# Patient Record
Sex: Male | Born: 1935 | Race: White | Hispanic: No | Marital: Married | Smoking: Never smoker
Health system: Southern US, Community
[De-identification: ages and names within clinical notes are randomized; demographics above are authoritative.]

---

## 2005-10-05 ENCOUNTER — Emergency Department: Payer: Self-pay | Admitting: Emergency Medicine

## 2005-10-21 ENCOUNTER — Ambulatory Visit: Payer: Self-pay | Admitting: Specialist

## 2005-10-21 ENCOUNTER — Other Ambulatory Visit: Payer: Self-pay

## 2005-10-28 ENCOUNTER — Ambulatory Visit: Payer: Self-pay | Admitting: Specialist

## 2006-06-27 ENCOUNTER — Ambulatory Visit: Payer: Self-pay | Admitting: Specialist

## 2006-10-27 ENCOUNTER — Inpatient Hospital Stay: Payer: Self-pay | Admitting: Internal Medicine

## 2006-10-27 ENCOUNTER — Other Ambulatory Visit: Payer: Self-pay

## 2007-12-27 IMAGING — CT CT ABD-PELV W/O CM
1 of 2 series · 15 of 32 positions shown, 19 images · non-contrast
Comparison: none

REASON FOR EXAM: Abdominal pain, stone protocol
COMMENTS:

PROCEDURE:     CT  - CT ABDOMEN AND PELVIS W[DATE] [DATE]
RESULT:     Unenhanced emergent CT scan of abdomen and pelvis was obtained
for abdominal and RIGHT flank pain.
The report was faxed to the Emergency Room.

[Series 2: stone · axial · 0.73mm/px · z∈[-1208,-806]mm · 15 of 151 slices shown, 19 images]
[im 11/151  soft-tissue]
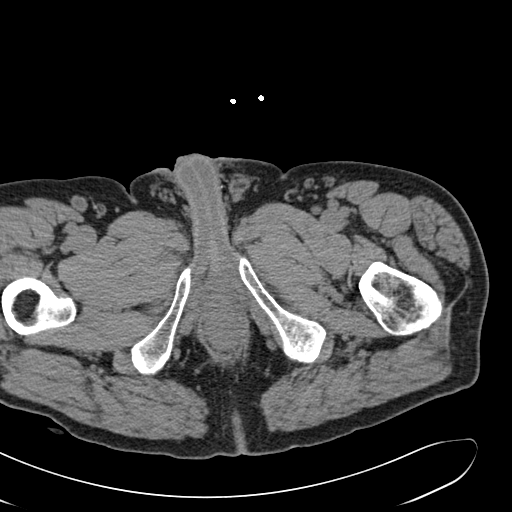
[im 11/151  bone]
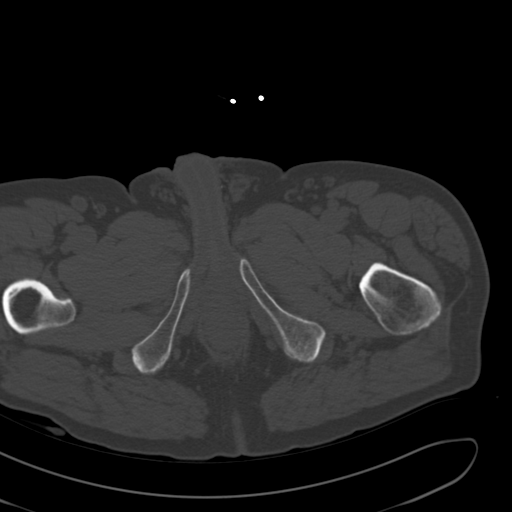
[im 21/151  soft-tissue]
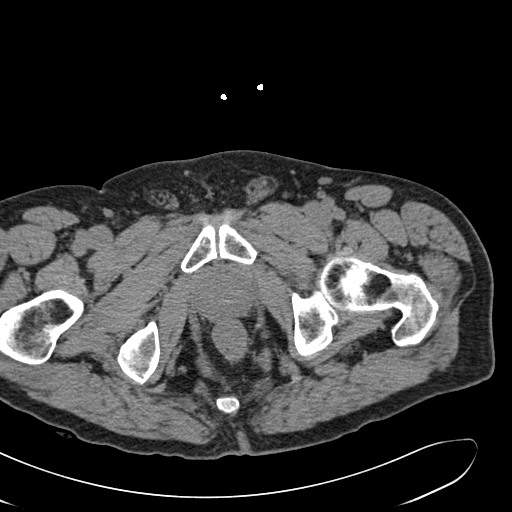
[im 32/151  soft-tissue]
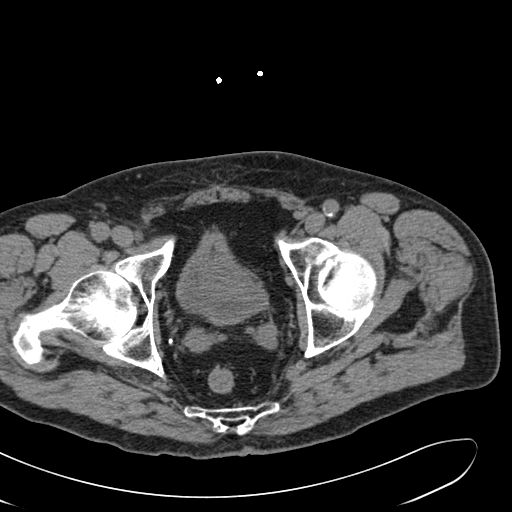
[im 42/151  soft-tissue]
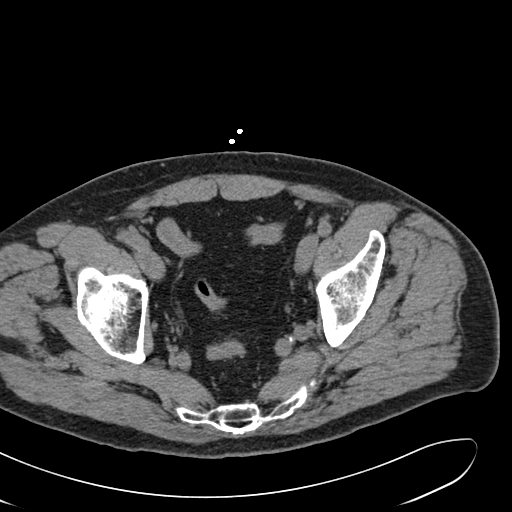
[im 52/151  soft-tissue]
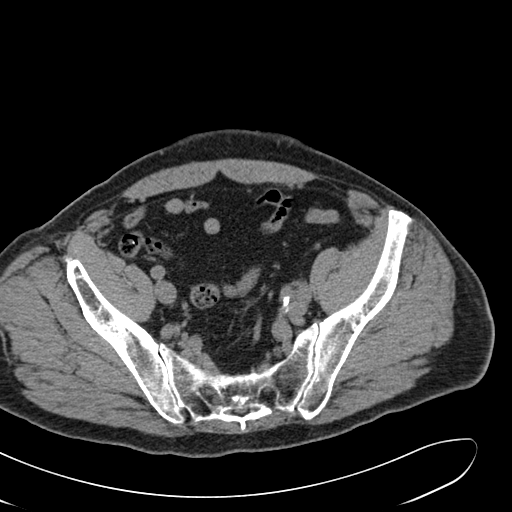
[im 63/151  soft-tissue]
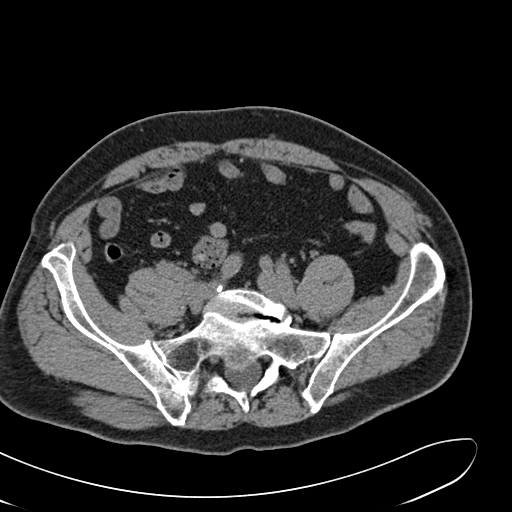
[im 78/151  soft-tissue]
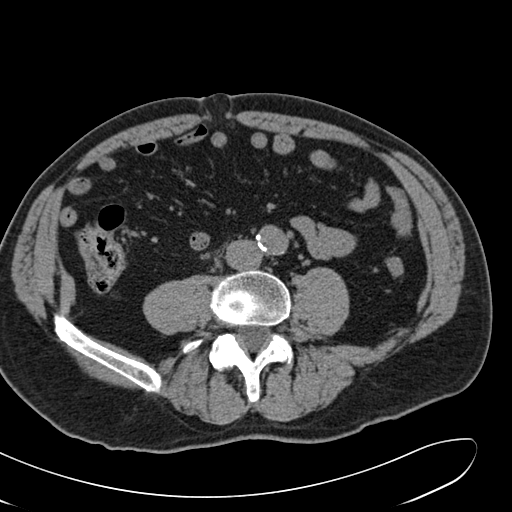
[im 88/151  soft-tissue]
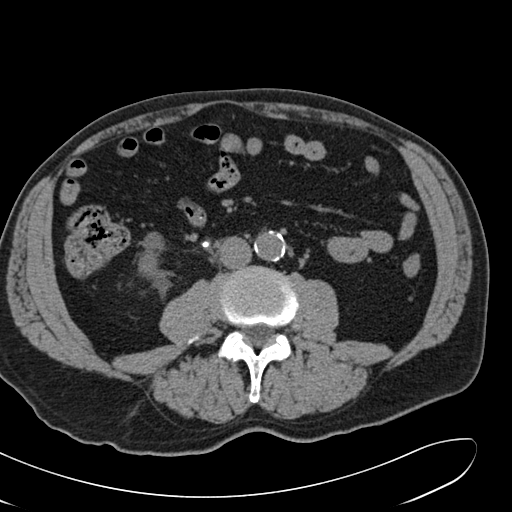
[im 99/151  soft-tissue]
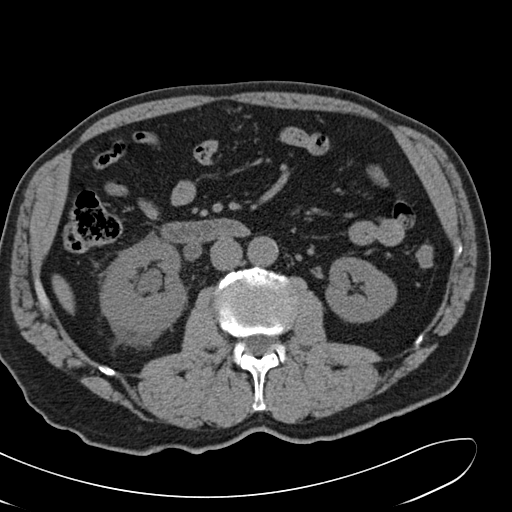
[im 99/151  bone]
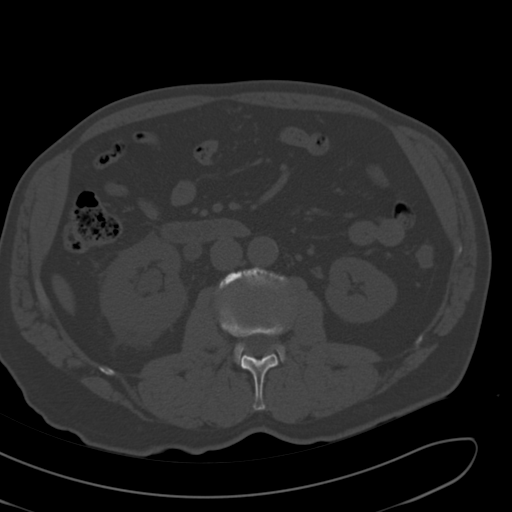
[im 109/151  soft-tissue]
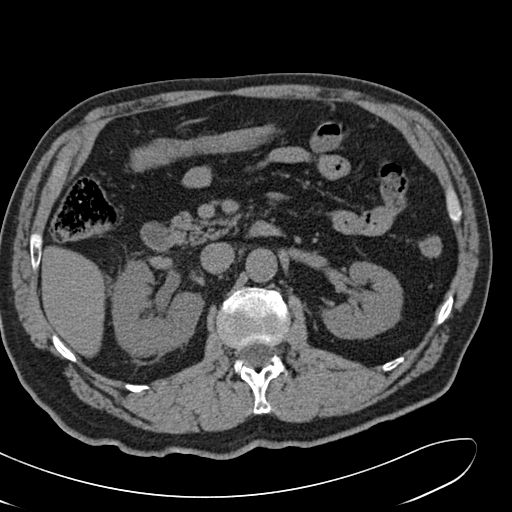
[im 119/151  soft-tissue]
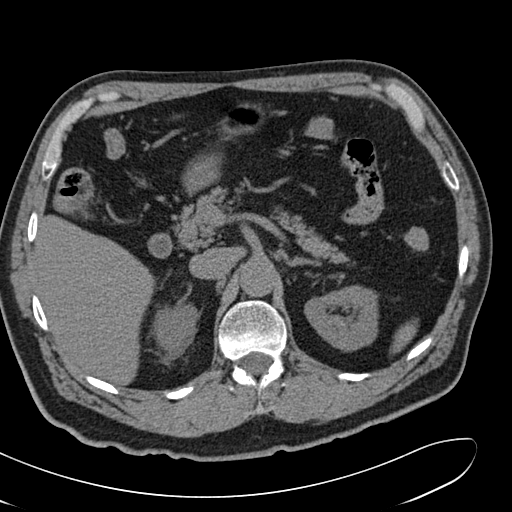
[im 130/151  soft-tissue]
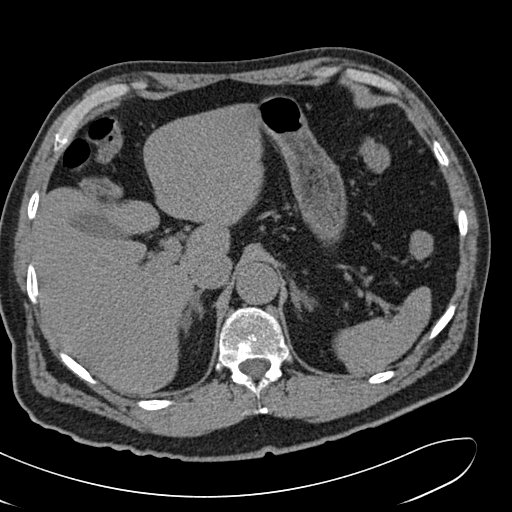
[im 130/151  lung]
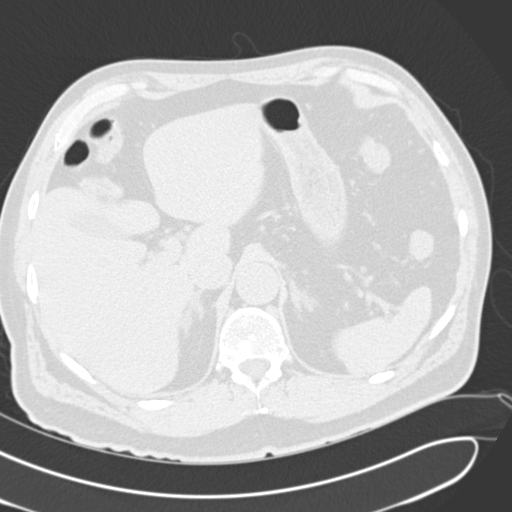
[im 135/151  lung]
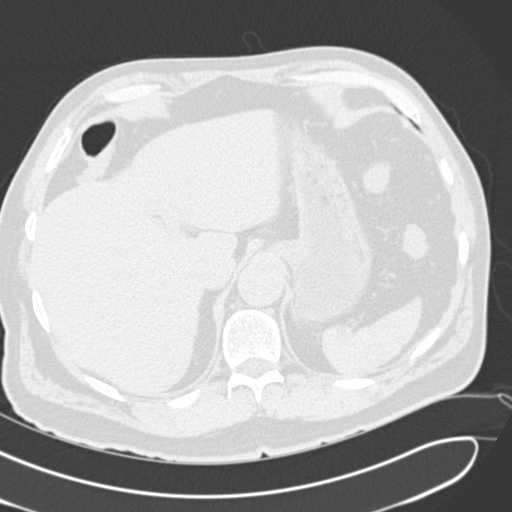
[im 140/151  soft-tissue]
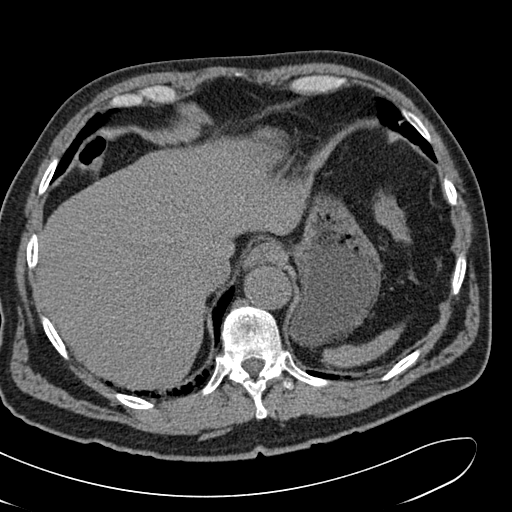
[im 140/151  lung]
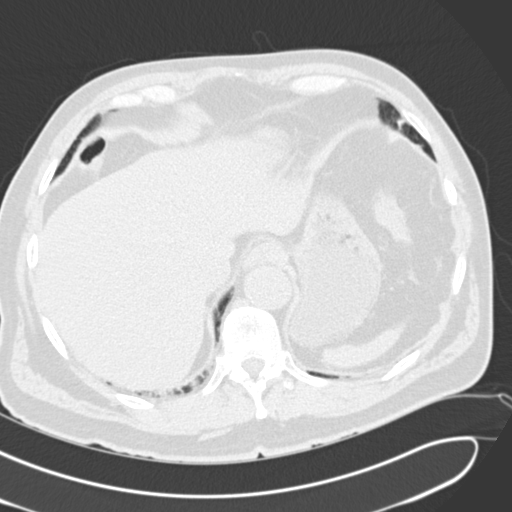
[im 145/151  lung]
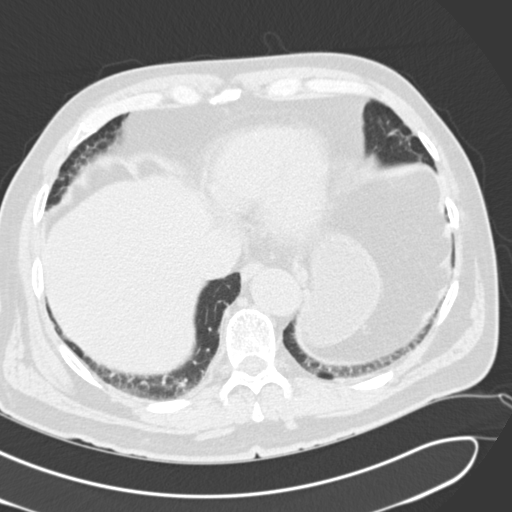

[15 of 32 positions shown; findings below may reference images not displayed]

FINDINGS: There is a 4.0 mm calculus in the mid pole of the RIGHT kidney.
There is stranding about the RIGHT kidney as well as mild hydronephrosis.

Within the proximal RIGHT ureter there is an approximately 6.0 mm calculus.
No fluid is noted in the abdomen or pelvis. There is no evidence of
appendicitis. A small periumbilical hernia containing fat is noted.

The images through the lung bases reveal the lung bases to be clear with no
effusion.
IMPRESSION: 1.     RIGHT perinephric stranding with mild hydronephrosis. There is a
proximal, RIGHT, 6.0 mm ureteral calculus noted. In addition, a RIGHT mid
pole renal calculus is seen.
2.     There is incidentally noted a small, exophytic cyst in the upper pole
of the LEFT kidney.
3.     In addition, there is noted a small, low density nodule in the RIGHT
adrenal most likely representing an adenoma. This can be confirmed with MRI.

## 2008-11-22 ENCOUNTER — Inpatient Hospital Stay: Payer: Self-pay | Admitting: *Deleted

## 2008-12-24 ENCOUNTER — Emergency Department: Payer: Self-pay | Admitting: Emergency Medicine

## 2011-02-13 IMAGING — CT CT CHEST W/ CM
1 of 2 series · 14 of 32 positions shown, 18 images · IV contrast (APPLIED)
Comparison: None

REASON FOR EXAM: tachycardia
COMMENTS:

PROCEDURE:     CT  - CT CHEST (FOR PE) W  - November 22, 2008  [DATE]
RESULT:     Indications: Tachycardia
TECHNIQUE: A thin-section spiral CT from the lung apices to the upper
abdomen was acquired on a multi slice scanner following 85 ml [id number]
intravenous contrast. These images were then transferred to the Siemens work
station and were subsequently reviewed utilizing 3-D reconstructions and MIP
images.

[Series 5: lung windows · axial · 0.91mm/px · z∈[-1218,-939]mm · 14 of 111 slices shown, 18 images]
[im 9/111  mediastinal]
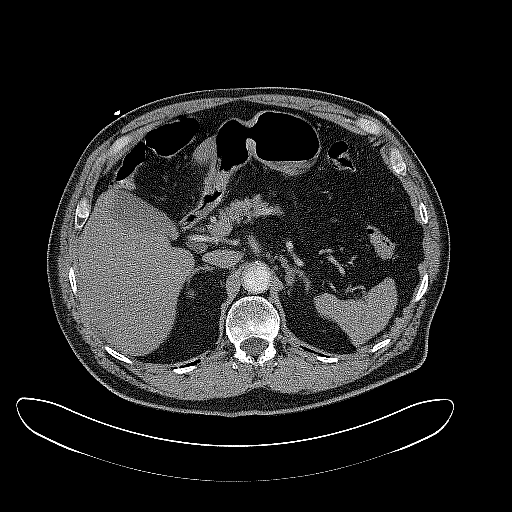
[im 9/111  lung]
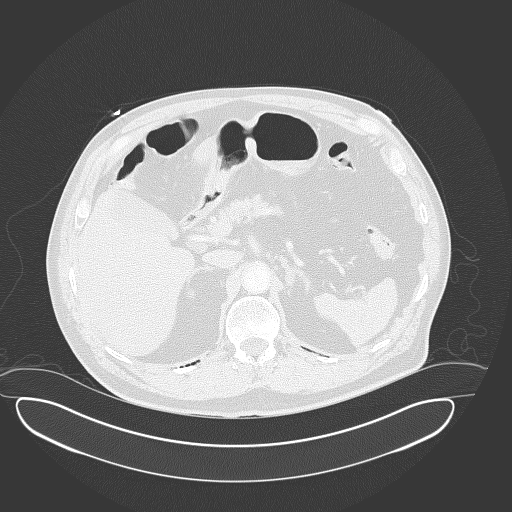
[im 17/111  lung]
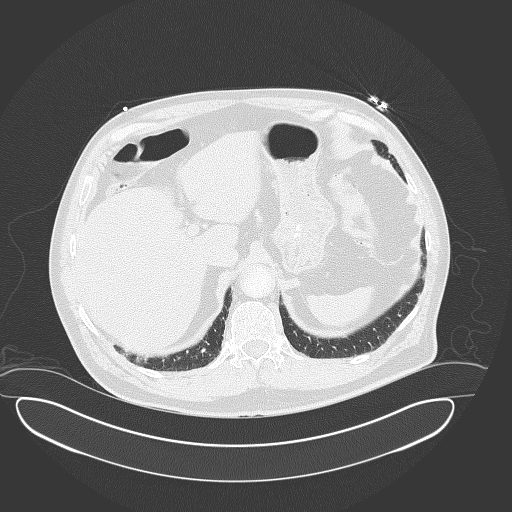
[im 26/111  lung]
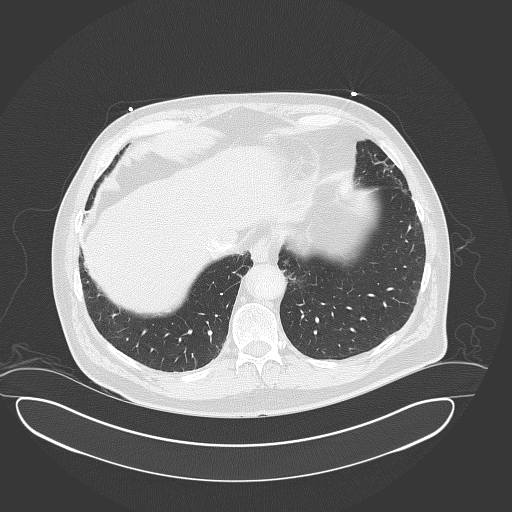
[im 34/111  lung]
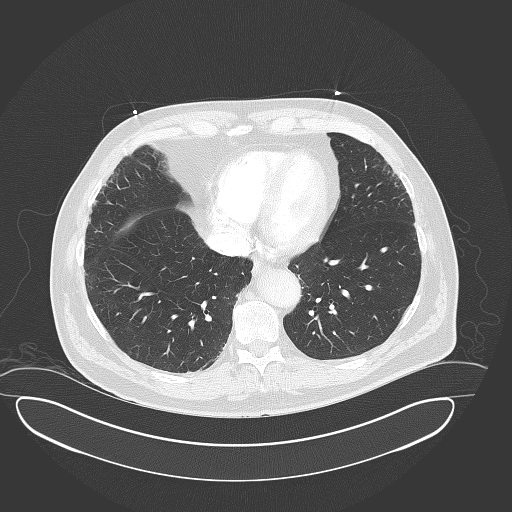
[im 43/111  mediastinal]
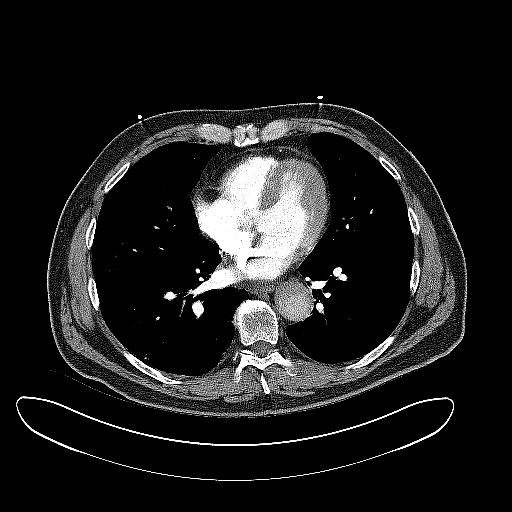
[im 43/111  lung]
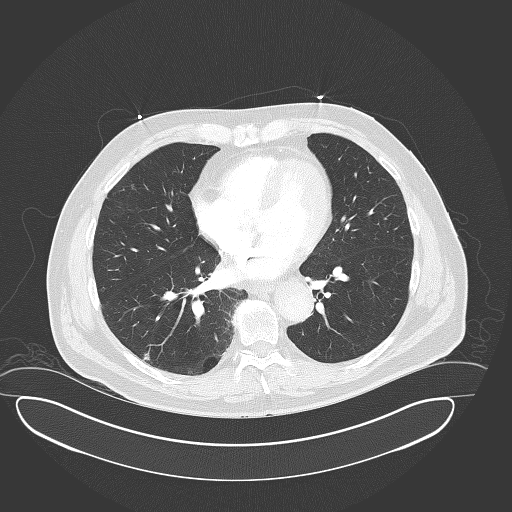
[im 51/111  lung]
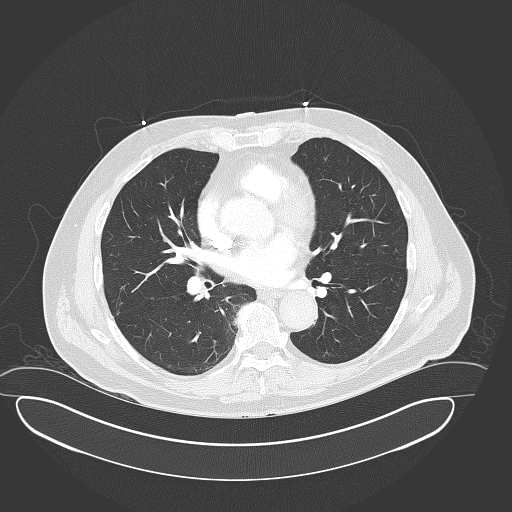
[im 52/111  lung]
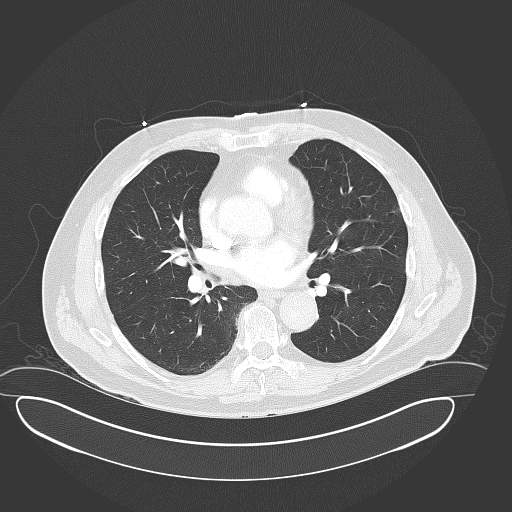
[im 56/111  lung]
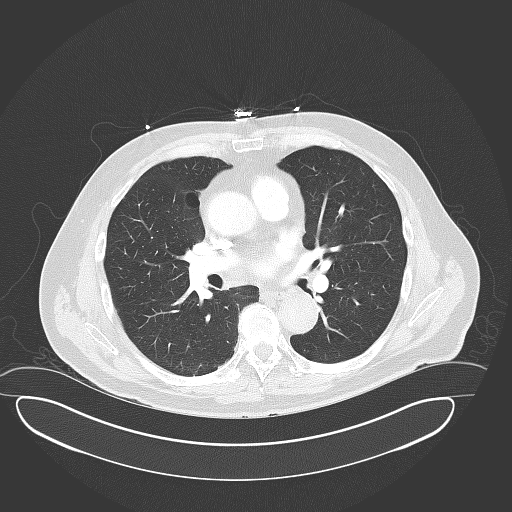
[im 60/111  mediastinal]
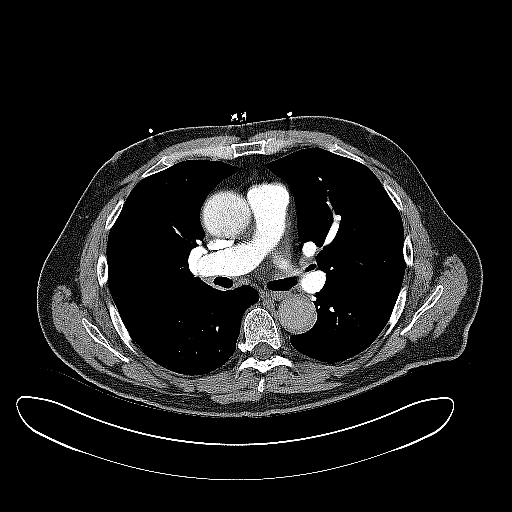
[im 60/111  lung]
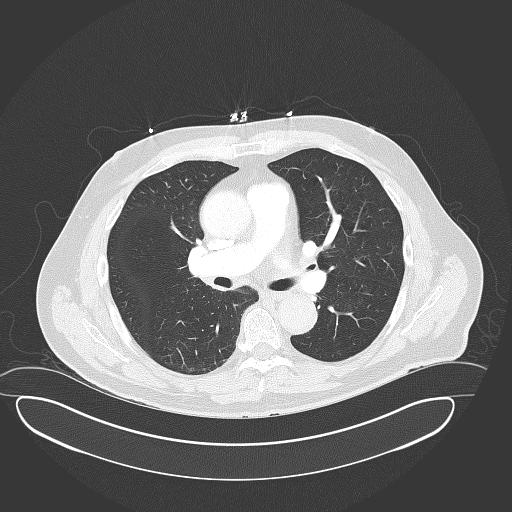
[im 68/111  lung]
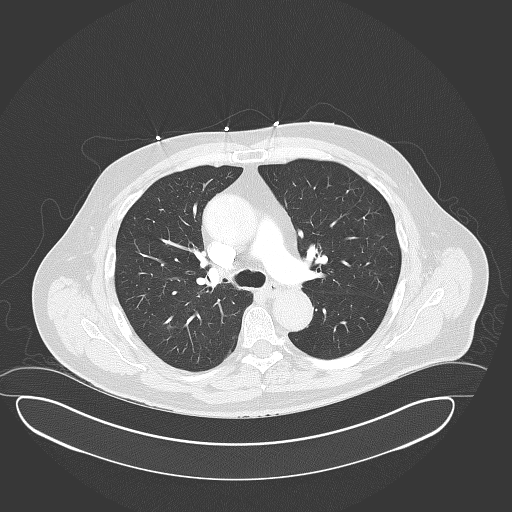
[im 77/111  lung]
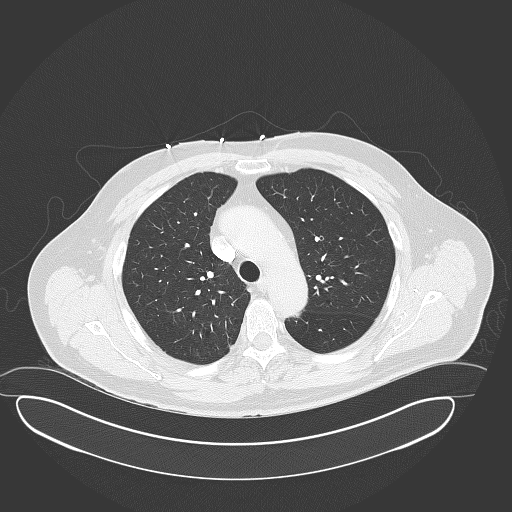
[im 85/111  lung]
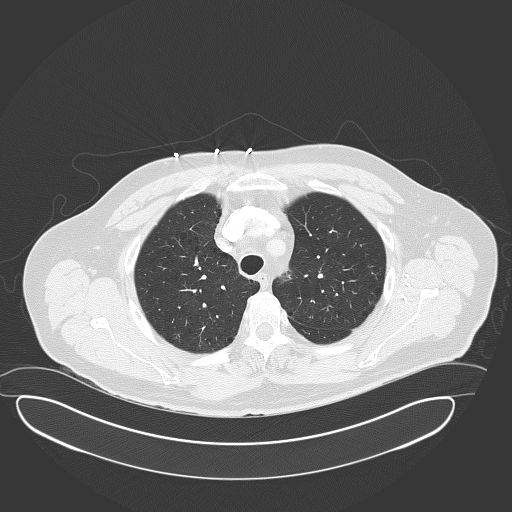
[im 94/111  mediastinal]
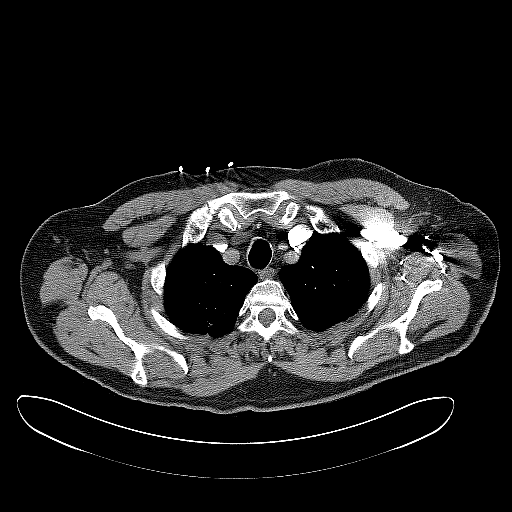
[im 94/111  lung]
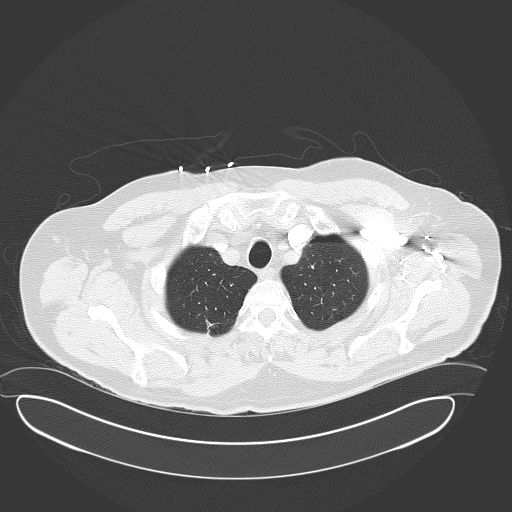
[im 102/111  lung]
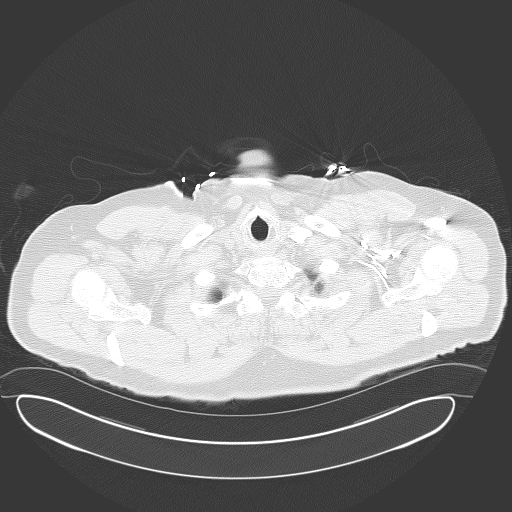

[14 of 32 positions shown; findings below may reference images not displayed]

FINDINGS: There is adequate opacification of the pulmonary arteries. There is no
pulmonary embolus. The main pulmonary artery, right main pulmonary artery,
and left main pulmonary arteries are normal in size. The heart size is
normal. There is no pericardial effusion.

There is a spiculated density in the right apex which may represent
scarring. There is a small nonspecific 3 mm subpleural pulmonary nodule in
the superior segment of the right lower lobe. There is no focal
consolidation, pleural effusion, or pneumothorax.

There is no axillary, hilar, or mediastinal adenopathy.

The osseous structures are unremarkable.

There is a nonspecific 1.4 cm right adrenal nodule which is incompletely
characterized on this exam, but not significantly again noted is a left
renal cyst. changed compared with 10/05/2005.
IMPRESSION: 1. No CT evidence of pulmonary embolus.
2. There is a spiculated density in the right apex which may represent
scarring. Recommend followup chest CT without intravenous contrast in 3-6
months.

## 2014-05-09 ENCOUNTER — Encounter: Payer: Self-pay | Admitting: Podiatry

## 2014-05-09 ENCOUNTER — Ambulatory Visit (INDEPENDENT_AMBULATORY_CARE_PROVIDER_SITE_OTHER): Payer: Medicare Other | Admitting: Podiatry

## 2014-05-09 VITALS — BP 123/77 | HR 74

## 2014-05-09 DIAGNOSIS — L97509 Non-pressure chronic ulcer of other part of unspecified foot with unspecified severity: Secondary | ICD-10-CM

## 2014-05-09 DIAGNOSIS — B351 Tinea unguium: Secondary | ICD-10-CM

## 2014-05-09 DIAGNOSIS — L97511 Non-pressure chronic ulcer of other part of right foot limited to breakdown of skin: Secondary | ICD-10-CM

## 2014-05-09 DIAGNOSIS — M79676 Pain in unspecified toe(s): Secondary | ICD-10-CM

## 2014-05-09 NOTE — Progress Notes (Signed)
   Subjective:    Patient ID: Bruce Thornton, male    DOB: 1929/05/21, 81 y.o.   MRN: [id number]  HPI  81 year old male presents the office they with his wife with complaints of thick, elongated, discolored toenails. The patient is diabetic as well as on Coumadin. The patient's wife states that she periodically which in his toenails however the last time she attempted she cut the skin on the right fifth toe which caused significant bleeding. Patient states the nails Raven the shoes causing discomfort. Denies any recent redness or drainage from around the nail sites. No other complaints at this time.   Review of Systems  All other systems reviewed and are negative.      Objective:   Physical Exam AAO x3, NAD DP/PT pulses palpable, CRT less than 3 seconds Protective sensation decreased with Dorann OuSimms Weinstein motto,, vibratory sensation intact, Achilles tendon reflex intact. Nails are hypertrophic, dystrophic, brittle, elongated 9. The left hallux nails previously been removed. There is no surrounding erythema or drainage from the nail sites. On the medial aspect of the right fifth digit there is a small superficial abrasion from where the skin had previously been cut. There is no surrounding erythema or any clinical signs of infection. No other areas of tenderness to bilateral lower extremities and no overlying erythema or increase in warmth. No pain with calf compression, swelling, warmth, erythema. No other open lesions or pre-ulcerative lesions identified.     Assessment & Plan:  81 year old male with symptomatic onychomycosis; superficial abrasion right medial fifth toe -Treatment options were discussed including alternatives, risks, complications. -Nail sharply debrided 9 without complication/bleeding. -On the right fifth toe recommended to apply small lateral interbody ointment and a Band-Aid daily. Monitor this area for any clinical signs or symptoms of infection and directed to  call the office immediately should any occur or go to the ER. -Discussed the importance of daily foot inspection. -Follow-up in 3 months or sooner should any problems arise. Follow-up in 2 weeks if the wound on the right toe is not healed or sooner should there be any problems.

## 2014-05-13 ENCOUNTER — Encounter: Payer: Self-pay | Admitting: Podiatry

## 2014-08-08 ENCOUNTER — Ambulatory Visit: Payer: Self-pay | Admitting: Podiatry

## 2014-08-15 ENCOUNTER — Ambulatory Visit: Payer: Medicare Other | Admitting: Podiatry

## 2014-08-22 ENCOUNTER — Ambulatory Visit (INDEPENDENT_AMBULATORY_CARE_PROVIDER_SITE_OTHER): Payer: Medicare Other | Admitting: Podiatry

## 2014-08-22 ENCOUNTER — Encounter: Payer: Self-pay | Admitting: Podiatry

## 2014-08-22 VITALS — Ht 70.0 in | Wt 188.0 lb

## 2014-08-22 DIAGNOSIS — M79676 Pain in unspecified toe(s): Secondary | ICD-10-CM

## 2014-08-22 DIAGNOSIS — B351 Tinea unguium: Secondary | ICD-10-CM

## 2014-08-22 NOTE — Patient Instructions (Signed)
Over the counter moisturizer you can get is called AmLactin.

## 2014-08-22 NOTE — Progress Notes (Signed)
Patient ID: Bruce Thornton, male   DOB: October 13, 1929, 81 y.o.   MRN: [id number]  Subjective: 81 y.o.-year-old male returns the office today for painful, elongated, thickened toenails which he is unable to trim himself. Denies any redness or drainage around the nails. Denies any acute changes since last appointment and no new complaints today. Denies any systemic complaints such as fevers, chills, nausea, vomiting.   Objective: AAO 3, NAD DP/PT pulses palpable, CRT less than 3 seconds Protective sensation decreased with Simms Weinstein monofilament, Achilles tendon reflex intact.  Nails hypertrophic, dystrophic, elongated, brittle, discolored 9. There is ingrowing along the nails. The left hallux nail has previously been removed. There is tenderness overlying thes nails. There is no surrounding erythema or drainage along the nail sites. No open lesions or pre-ulcerative lesions are identified. No other areas of tenderness bilateral lower extremities. No overlying edema, erythema, increased warmth. No pain with calf compression, swelling, warmth, erythema.  Assessment: Patient presents with symptomatic onychomycosis  Plan: -Treatment options including alternatives, risks, complications were discussed -Nails sharply debrided 9 without complication/bleeding. -Discussed daily foot inspection. If there are any changes, to call the office immediately.  -Follow-up in 3 months or sooner if any problems are to arise. In the meantime, encouraged to call the office with any questions, concerns, changes symptoms.

## 2014-12-03 ENCOUNTER — Ambulatory Visit (INDEPENDENT_AMBULATORY_CARE_PROVIDER_SITE_OTHER): Payer: Medicare Other | Admitting: Podiatry

## 2014-12-03 DIAGNOSIS — M79676 Pain in unspecified toe(s): Secondary | ICD-10-CM | POA: Diagnosis not present

## 2014-12-03 DIAGNOSIS — B351 Tinea unguium: Secondary | ICD-10-CM | POA: Diagnosis not present

## 2014-12-03 NOTE — Progress Notes (Signed)
Patient ID: Bruce Thornton, male   DOB: May 10, 1929, 81 y.o.   MRN: [id number]  Subjective: 81 y.o.-year-old male returns the office today for painful, elongated, thickened toenails which he is unable to trim himself. Denies any redness or drainage around the nails. Denies any acute changes since last appointment and no new complaints today. Denies any systemic complaints such as fevers, chills, nausea, vomiting.   Objective: AAO 3, NAD DP/PT pulses palpable, CRT less than 3 seconds Protective sensation decreased with Simms Weinstein monofilament, Achilles tendon reflex intact.  Nails hypertrophic, dystrophic, elongated, brittle, discolored 9. There is ingrowing along the nails. The left hallux nail has previously been removed. There is tenderness overlying thes nails 1-5 on the right and 2-5 on the left. There is no surrounding erythema or drainage along the nail sites. No open lesions or pre-ulcerative lesions are identified. No other areas of tenderness bilateral lower extremities. No overlying edema, erythema, increased warmth. No pain with calf compression, swelling, warmth, erythema.  Assessment: Patient presents with symptomatic onychomycosis  Plan: -Treatment options including alternatives, risks, complications were discussed -Nails sharply debrided 9 without complication/bleeding. -Discussed daily foot inspection. If there are any changes, to call the office immediately.  -Follow-up in 3 months or sooner if any problems are to arise. In the meantime, encouraged to call the office with any questions, concerns, changes symptoms.   Ovid Curd, DPM

## 2015-02-17 ENCOUNTER — Encounter: Payer: Self-pay | Admitting: Podiatry

## 2015-03-06 ENCOUNTER — Ambulatory Visit: Payer: Medicare Other

## 2015-03-07 ENCOUNTER — Ambulatory Visit (INDEPENDENT_AMBULATORY_CARE_PROVIDER_SITE_OTHER): Payer: Medicare Other | Admitting: Sports Medicine

## 2015-03-07 ENCOUNTER — Encounter: Payer: Self-pay | Admitting: Sports Medicine

## 2015-03-07 DIAGNOSIS — B351 Tinea unguium: Secondary | ICD-10-CM

## 2015-03-07 DIAGNOSIS — M79676 Pain in unspecified toe(s): Secondary | ICD-10-CM | POA: Diagnosis not present

## 2015-03-07 DIAGNOSIS — E119 Type 2 diabetes mellitus without complications: Secondary | ICD-10-CM

## 2015-03-07 DIAGNOSIS — I739 Peripheral vascular disease, unspecified: Secondary | ICD-10-CM

## 2015-03-08 NOTE — Progress Notes (Signed)
Patient ID: Bruce Thornton, male   DOB: July 09, 1929, 81 y.o.   MRN: [id number] Subjective: Bruce Thornton is a 81 y.o. male patient with history of type 2 diabetes who presents to office today complaining of long, painful nails  while ambulating in shoes; unable to trim. Patient states that the glucose reading this morning was not recorded; can not recall. Patient denies any new changes in medication or new problems. Patient denies any new cramping, numbness, burning or tingling in the legs.  There are no active problems to display for this patient.  Current Outpatient Prescriptions on File Prior to Visit  Medication Sig Dispense Refill  . benzonatate (TESSALON) 100 MG capsule Take by mouth.    . donepezil (ARICEPT) 10 MG tablet Take by mouth.    . levothyroxine (SYNTHROID, LEVOTHROID) 88 MCG tablet Take by mouth.    . levothyroxine (SYNTHROID, LEVOTHROID) 88 MCG tablet     . metFORMIN (GLUCOPHAGE) 500 MG tablet Take by mouth.    . metFORMIN (GLUCOPHAGE) 500 MG tablet     . metoprolol succinate (TOPROL-XL) 25 MG 24 hr tablet Take by mouth.    . OLANZapine (ZYPREXA) 2.5 MG tablet     . pravastatin (PRAVACHOL) 80 MG tablet Take by mouth.    . ramipril (ALTACE) 10 MG capsule Take by mouth.    . warfarin (COUMADIN) 5 MG tablet 1.5 tablet on Sunday, Tues, Thurs and Sat and 1 tablet all other days     No current facility-administered medications on file prior to visit.   No Known Allergies   Labs: HEMOGLOBIN A1C- no recent lab on file  Objective: General: Patient is awake, alert, and oriented x 3 and in no acute distress.  Integument: Skin is warm, dry and supple bilateral. Nails are tender, long, thickened and  dystrophic with subungual debris, consistent with onychomycosis, 1-5 bilateral; there is a thin piece of nail at the left hallux from previous avulsion. No signs of infection. No open lesions or preulcerative lesions present bilateral. Remaining integument  unremarkable.  Vasculature:  Dorsalis Pedis pulse 1/4 bilateral. Posterior Tibial pulse  0/4 bilateral.  Capillary fill time <3 sec 1-5 bilateral. No hair growth to the level of the digits. Temperature gradient within normal limits. + varicosities present bilateral. No edema present bilateral.   Neurology: The patient has diminished sensation measured with a 5.07/10g Semmes Weinstein Monofilament at all pedal sites bilateral . Vibratory sensation diminished bilateral with tuning fork. No Babinski sign present bilateral.   Musculoskeletal: No gross pedal deformities noted bilateral. Muscular strength 5/5 in all lower extremity muscular groups bilateral without pain or limitation on range of motion . No tenderness with calf compression bilateral.  Assessment and Plan: Problem List Items Addressed This Visit    None    Visit Diagnoses    Dermatophytosis of nail    -  Primary    Pain of toe, unspecified laterality        Diabetes mellitus without complication (HCC)        PVD (peripheral vascular disease) (HCC)           -Examined patient. -Discussed and educated patient on diabetic foot care, especially with  regards to the vascular, neurological and musculoskeletal systems.  -Stressed the importance of good glycemic control and the detriment of not  controlling glucose levels in relation to the foot. -Mechanically debrided all nails 1-5 bilateral using sterile nail nipper and filed with dremel without incident  -Answered all patient questions -Patient to return in  3 months for at risk foot care -Patient advised to call the office if any problems or questions arise in the  Meantime.  Asencion Islamitorya Drake Wuertz, DPM

## 2015-06-06 ENCOUNTER — Ambulatory Visit: Payer: Medicare Other | Admitting: Sports Medicine

## 2017-03-05 DEATH — deceased
